# Patient Record
Sex: Male | Born: 1965 | Race: White | Hispanic: No | Marital: Married | Smoking: Never smoker
Health system: Southern US, Community
[De-identification: ages and names within clinical notes are randomized; demographics above are authoritative.]

---

## 2000-12-15 ENCOUNTER — Emergency Department (HOSPITAL_COMMUNITY): Admission: EM | Admit: 2000-12-15 | Discharge: 2000-12-15 | Payer: Self-pay | Admitting: Emergency Medicine

## 2000-12-15 ENCOUNTER — Encounter: Payer: Self-pay | Admitting: Emergency Medicine

## 2008-04-22 ENCOUNTER — Encounter: Admission: RE | Admit: 2008-04-22 | Discharge: 2008-04-22 | Payer: Self-pay | Admitting: Gastroenterology

## 2009-10-31 IMAGING — CT CT ABDOMEN W/ CM
3 of 5 series · 15 of 32 positions shown, 19 images · IV contrast (READICAT/WATER & [ID] OMNI 300)
Comparison: None

CLINICAL DATA: Flank pain

CT ABDOMEN WITH CONTRAST
TECHNIQUE: Multidetector CT imaging of the abdomen was performed
following the standard protocol during bolus administration of
intravenous contrast.
Contrast: 100 ml Jmnipaque-JZZ

[Series 2: abdomen w/ · axial · 0.76mm/px · z∈[-208,-68]mm · 3 of 56 slices shown, 7 images]
[im 14/56  soft-tissue]
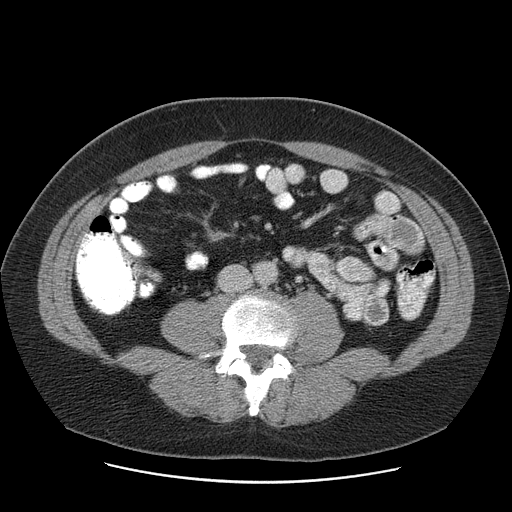
[im 14/56  lung]
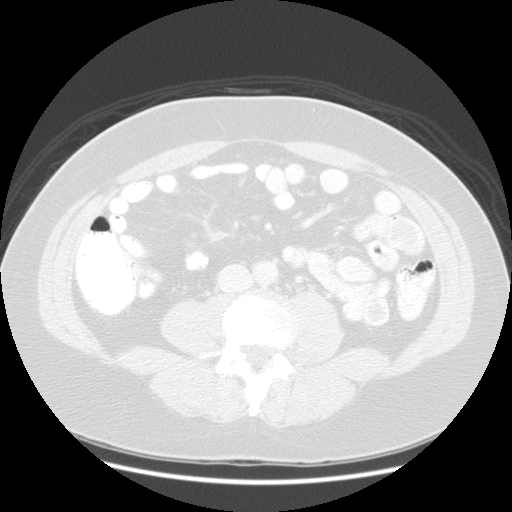
[im 14/56  bone]
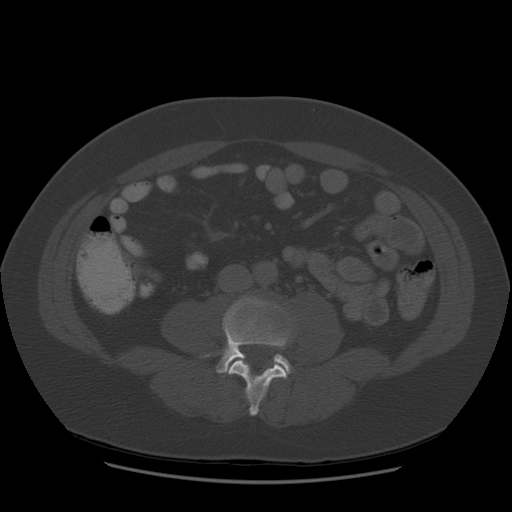
[im 28/56  soft-tissue]
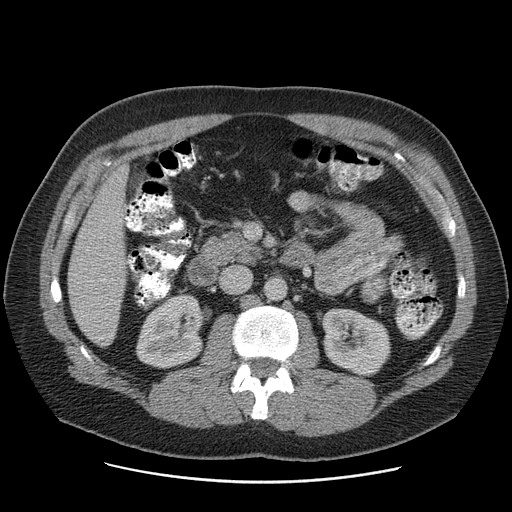
[im 28/56  lung]
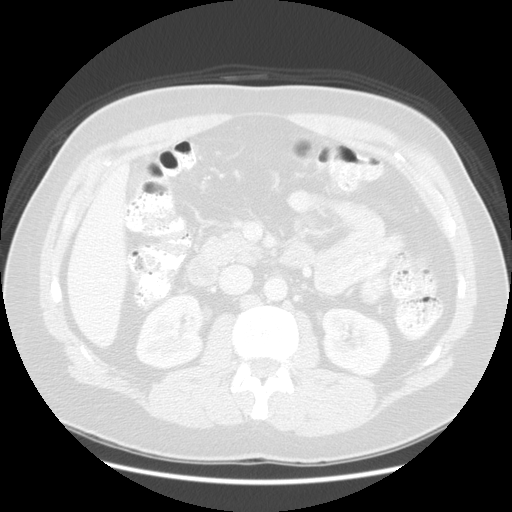
[im 42/56  soft-tissue]
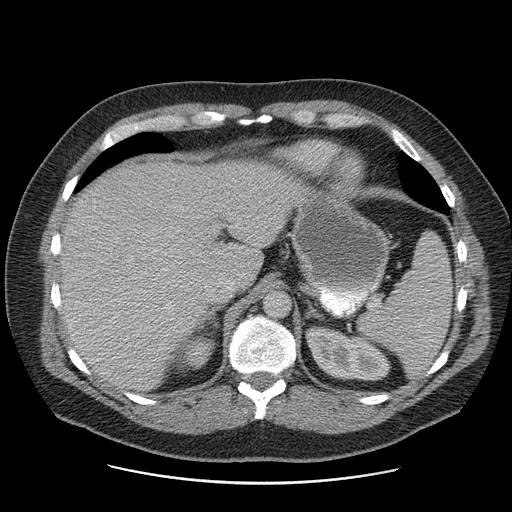
[im 42/56  lung]
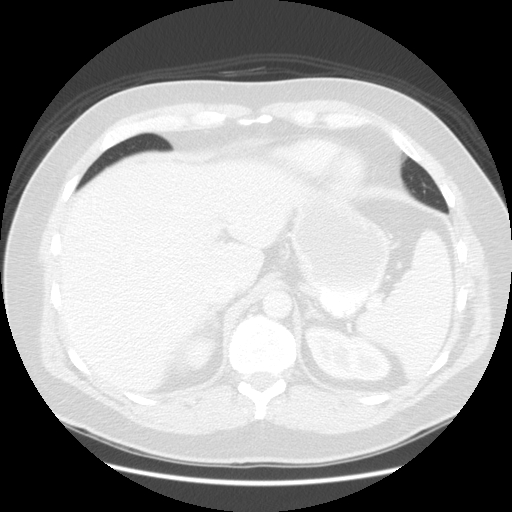

[Series 400: coronal · coronal · 0.76mm/px · 4 of 113 slices shown]
[im 13/113  soft-tissue]
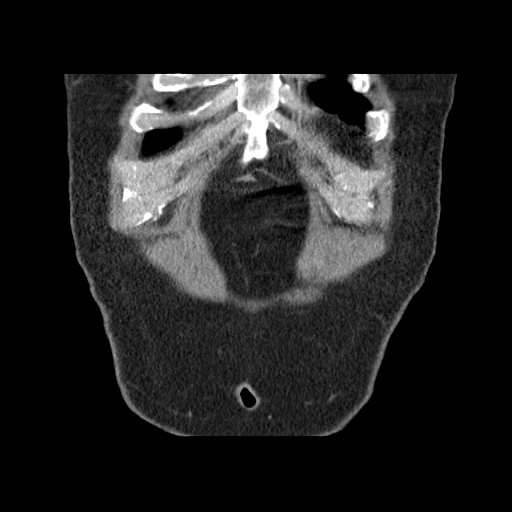
[im 25/113  soft-tissue]
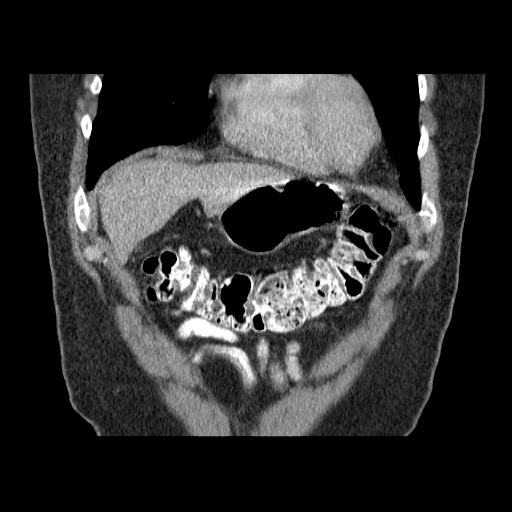
[im 38/113  soft-tissue]
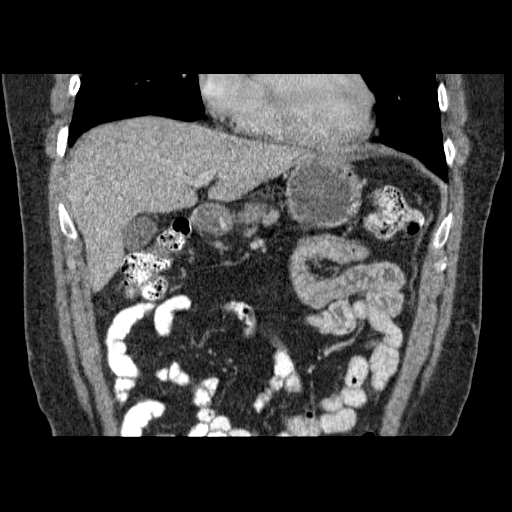
[im 50/113  soft-tissue]
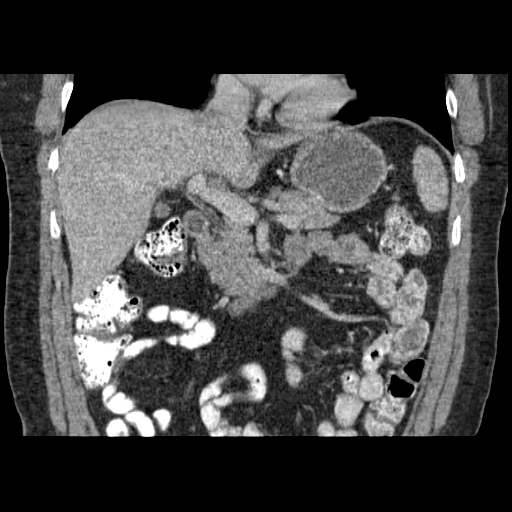

[Series 401: sagittal · sagittal · 0.76mm/px · 8 of 153 slices shown]
[im 13/153  soft-tissue]
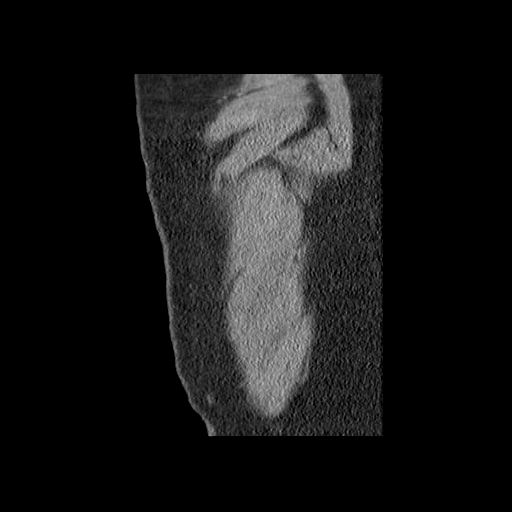
[im 39/153  soft-tissue]
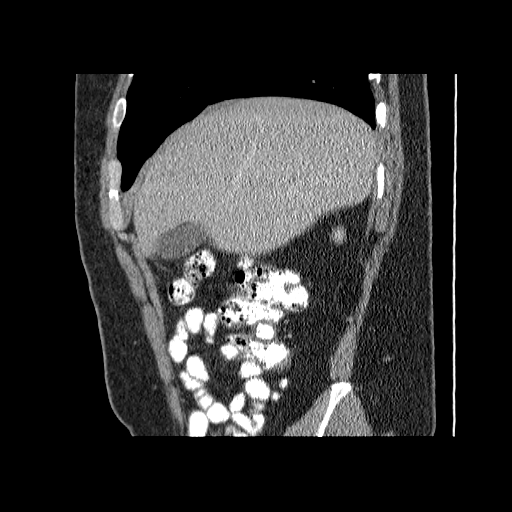
[im 51/153  soft-tissue]
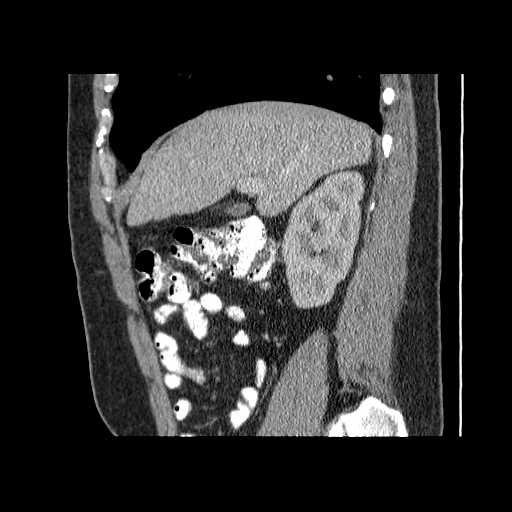
[im 64/153  soft-tissue]
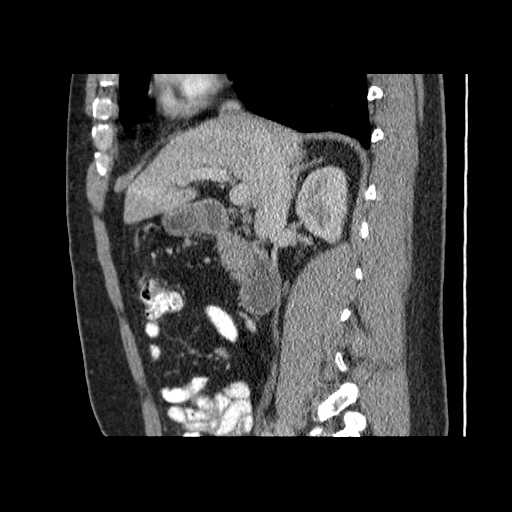
[im 89/153  soft-tissue]
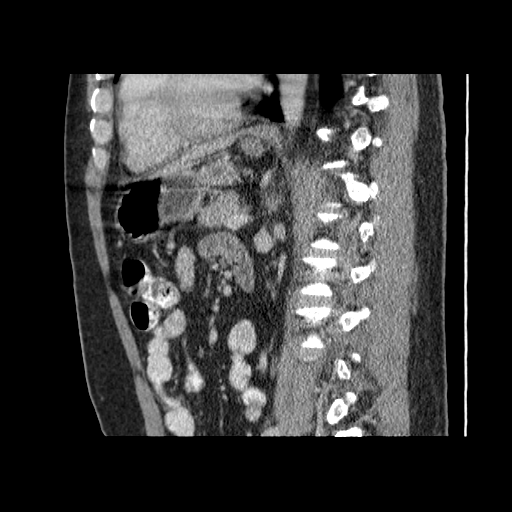
[im 102/153  soft-tissue]
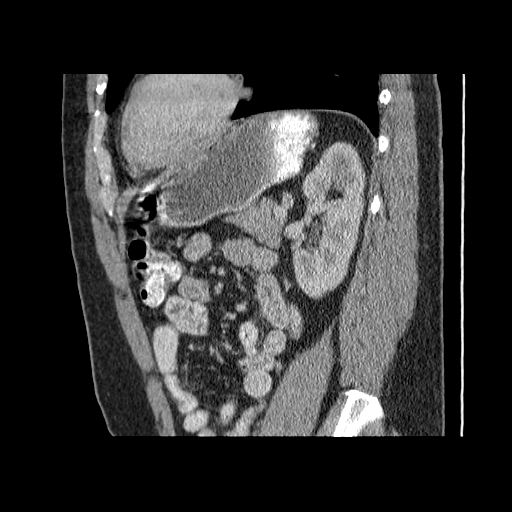
[im 115/153  soft-tissue]
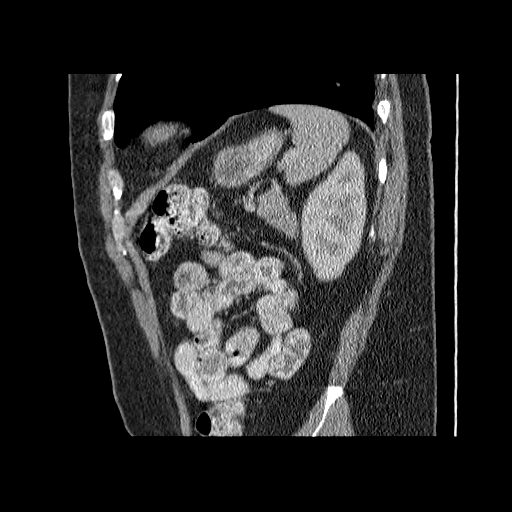
[im 140/153  soft-tissue]
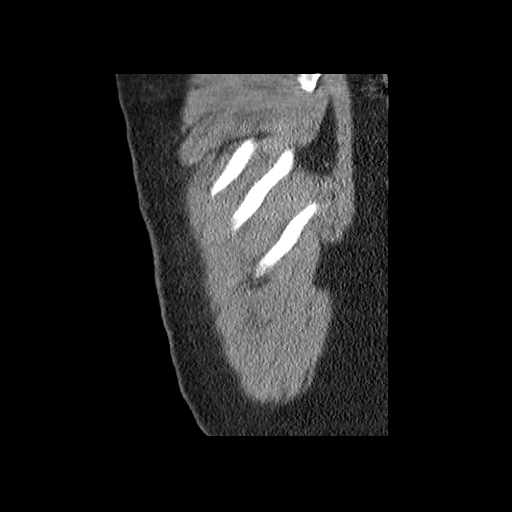

[15 of 32 positions shown; findings below may reference images not displayed]

FINDINGS: Tiny hypodensity in the dome of the liver is too small to
characterize but is probably a tiny cyst.  A similar probable cyst
is seen in the inferior right liver.  No focal abnormalities seen
in the spleen.  The stomach, duodenum, pancreas, gallbladder,
adrenal glands, and kidneys have normal imaging features.  No
evidence for renal stones.  No hydronephrosis.

There is no intraperitoneal free fluid.  No abdominal
lymphadenopathy.  No abdominal aortic aneurysm.

Bone windows show no worrisome lytic or sclerotic osseous lesions.
IMPRESSION: Two tiny hypodensities in the liver parenchyma are most likely
cysts.

Otherwise unremarkable study.

## 2015-04-07 ENCOUNTER — Other Ambulatory Visit: Payer: Self-pay | Admitting: Internal Medicine

## 2015-04-07 ENCOUNTER — Ambulatory Visit
Admission: RE | Admit: 2015-04-07 | Discharge: 2015-04-07 | Disposition: A | Discharge status: Home / Self Care | Payer: BLUE CROSS/BLUE SHIELD | Source: Ambulatory Visit | Attending: Internal Medicine | Admitting: Internal Medicine

## 2015-04-07 DIAGNOSIS — R059 Cough, unspecified: Secondary | ICD-10-CM

## 2015-04-07 DIAGNOSIS — R05 Cough: Secondary | ICD-10-CM

## 2016-03-20 DIAGNOSIS — S66911A Strain of unspecified muscle, fascia and tendon at wrist and hand level, right hand, initial encounter: Secondary | ICD-10-CM | POA: Diagnosis not present

## 2016-10-11 DIAGNOSIS — E669 Obesity, unspecified: Secondary | ICD-10-CM | POA: Diagnosis not present

## 2016-10-11 DIAGNOSIS — Z0000 Encounter for general adult medical examination without abnormal findings: Secondary | ICD-10-CM | POA: Diagnosis not present

## 2016-10-11 DIAGNOSIS — L309 Dermatitis, unspecified: Secondary | ICD-10-CM | POA: Diagnosis not present

## 2017-02-26 DIAGNOSIS — H66001 Acute suppurative otitis media without spontaneous rupture of ear drum, right ear: Secondary | ICD-10-CM | POA: Diagnosis not present

## 2017-03-06 DIAGNOSIS — H65191 Other acute nonsuppurative otitis media, right ear: Secondary | ICD-10-CM | POA: Diagnosis not present

## 2017-03-06 DIAGNOSIS — J069 Acute upper respiratory infection, unspecified: Secondary | ICD-10-CM | POA: Diagnosis not present

## 2017-03-10 DIAGNOSIS — J069 Acute upper respiratory infection, unspecified: Secondary | ICD-10-CM | POA: Diagnosis not present

## 2017-04-18 DIAGNOSIS — R0981 Nasal congestion: Secondary | ICD-10-CM | POA: Diagnosis not present

## 2017-08-22 DIAGNOSIS — E663 Overweight: Secondary | ICD-10-CM | POA: Diagnosis not present

## 2017-08-22 DIAGNOSIS — R202 Paresthesia of skin: Secondary | ICD-10-CM | POA: Diagnosis not present

## 2017-08-22 DIAGNOSIS — Z833 Family history of diabetes mellitus: Secondary | ICD-10-CM | POA: Diagnosis not present

## 2017-10-09 DIAGNOSIS — L570 Actinic keratosis: Secondary | ICD-10-CM | POA: Diagnosis not present

## 2017-10-09 DIAGNOSIS — D2262 Melanocytic nevi of left upper limb, including shoulder: Secondary | ICD-10-CM | POA: Diagnosis not present

## 2017-10-09 DIAGNOSIS — L821 Other seborrheic keratosis: Secondary | ICD-10-CM | POA: Diagnosis not present

## 2017-10-09 DIAGNOSIS — D1801 Hemangioma of skin and subcutaneous tissue: Secondary | ICD-10-CM | POA: Diagnosis not present

## 2017-10-09 DIAGNOSIS — D485 Neoplasm of uncertain behavior of skin: Secondary | ICD-10-CM | POA: Diagnosis not present

## 2017-10-09 DIAGNOSIS — D225 Melanocytic nevi of trunk: Secondary | ICD-10-CM | POA: Diagnosis not present

## 2017-10-15 DIAGNOSIS — Z0000 Encounter for general adult medical examination without abnormal findings: Secondary | ICD-10-CM | POA: Diagnosis not present

## 2017-10-15 DIAGNOSIS — Z13220 Encounter for screening for lipoid disorders: Secondary | ICD-10-CM | POA: Diagnosis not present

## 2017-10-15 DIAGNOSIS — Z125 Encounter for screening for malignant neoplasm of prostate: Secondary | ICD-10-CM | POA: Diagnosis not present

## 2018-03-03 ENCOUNTER — Ambulatory Visit (INDEPENDENT_AMBULATORY_CARE_PROVIDER_SITE_OTHER): Payer: Self-pay | Admitting: Orthopaedic Surgery

## 2018-09-11 ENCOUNTER — Ambulatory Visit (INDEPENDENT_AMBULATORY_CARE_PROVIDER_SITE_OTHER): Payer: BLUE CROSS/BLUE SHIELD | Admitting: Orthopaedic Surgery

## 2018-09-11 ENCOUNTER — Encounter (INDEPENDENT_AMBULATORY_CARE_PROVIDER_SITE_OTHER): Payer: Self-pay | Admitting: Orthopaedic Surgery

## 2018-09-11 ENCOUNTER — Ambulatory Visit (INDEPENDENT_AMBULATORY_CARE_PROVIDER_SITE_OTHER): Payer: Self-pay

## 2018-09-11 VITALS — BP 135/87 | HR 67 | Ht 71.0 in | Wt 220.0 lb

## 2018-09-11 DIAGNOSIS — M79671 Pain in right foot: Secondary | ICD-10-CM | POA: Diagnosis not present

## 2018-09-11 NOTE — Progress Notes (Signed)
Office Visit Note   Patient: Karl SneddonWesley Huynh           Date of Birth: 01-Feb-1966           MRN: [id number] Visit Date: 09/11/2018              Requested by: No referring provider defined for this encounter. PCP: Kirby FunkGriffin, John, MD   Assessment & Plan: Visit Diagnoses:  1. Pain in right foot     Plan: We will place patient in a Swede-O ankle brace for support.  He can call in 6 to 8 weeks if he like to be referred for physical therapy for bilateral peroneal strengthening exercises.  Wise office follow-up as needed.  Follow-Up Instructions: Return if symptoms worsen or fail to improve.   Orders:  Orders Placed This Encounter  Procedures  . XR Foot Complete Right   No orders of the defined types were placed in this encounter.     Procedures: No procedures performed   Clinical Data: No additional findings.   Subjective: Chief Complaint  Patient presents with  . Right Foot - Pain    DOI 09/10/18    HPI 52 year old male with acute injury to his right foot when he slipped going down the stairs on 09/10/2018.  Foot went under him and he has had pain anterolaterally over the ankle and lateral foot at the base of the fifth metatarsal.  He has been able to walk but is limping.  Short stride gait with lateral foot pain.  No past history of ankle injuries.  He has had some intermittent symptoms over the extensor brevis of both ankles and notes that his foot feels weak at times.  Denies associated back pain no chills or fever.  Review of Systems  Constitutional: Negative for chills and diaphoresis.  HENT: Negative for ear discharge, ear pain and nosebleeds.   Eyes: Negative for discharge and visual disturbance.  Respiratory: Negative for cough, choking and shortness of breath.   Cardiovascular: Negative for chest pain and palpitations.  Gastrointestinal: Negative for abdominal distention and abdominal pain.  Endocrine: Negative for cold intolerance and heat intolerance.    Genitourinary: Negative for flank pain and hematuria.  Skin: Negative for rash and wound.  Neurological: Negative for seizures and speech difficulty.  Hematological: Negative for adenopathy. Does not bruise/bleed easily.  Psychiatric/Behavioral: Negative for agitation and suicidal ideas.  No previous surgeries.   Objective: Vital Signs: BP 135/87   Pulse 67   Ht 5\' 11"  (1.803 m)   Wt 220 lb (99.8 kg)   BMI 30.68 kg/m   Physical Exam Constitutional:      Appearance: He is well-developed.  HENT:     Head: Normocephalic and atraumatic.  Eyes:     Pupils: Pupils are equal, round, and reactive to light.  Neck:     Thyroid: No thyromegaly.     Trachea: No tracheal deviation.  Cardiovascular:     Rate and Rhythm: Normal rate.  Pulmonary:     Effort: Pulmonary effort is normal.     Breath sounds: No wheezing.  Abdominal:     General: Bowel sounds are normal.     Palpations: Abdomen is soft.  Skin:    General: Skin is warm and dry.     Capillary Refill: Capillary refill takes less than 2 seconds.  Neurological:     Mental Status: He is alert and oriented to person, place, and time.  Psychiatric:        Behavior:  Behavior normal.        Thought Content: Thought content normal.        Judgment: Judgment normal.     Ortho Exam patient has some tenderness of the extensor brevis without weakness.  Tenderness over the peroneal brevis insertion and mild tenderness over the proximal fifth metatarsal without ecchymosis.  1+ symmetrical anterior drawer both ankles.  Peroneals take good resistance.  No heel cord tightness.  Pulses are normal.  Negative popliteal compression test.  Specialty Comments:  No specialty comments available.  Imaging: Xr Foot Complete Right  Result Date: 09/11/2018 Three-view x-rays right foot obtained and reviewed.  These are negative for acute changes.  Some mild dorsal degenerative spurring noted with high arch.  Soft tissue calcification. Impression:  Normal right foot x-rays negative for acute changes.    PMFS History: There are no active problems to display for this patient.  History reviewed. No pertinent past medical history.  History reviewed. No pertinent family history.  History reviewed. No pertinent surgical history. Social History   Occupational History  . Not on file  Tobacco Use  . Smoking status: Never Smoker  . Smokeless tobacco: Never Used  Substance and Sexual Activity  . Alcohol use: Yes  . Drug use: Not on file  . Sexual activity: Not on file

## 2018-10-20 DIAGNOSIS — D1801 Hemangioma of skin and subcutaneous tissue: Secondary | ICD-10-CM | POA: Diagnosis not present

## 2018-10-20 DIAGNOSIS — D2262 Melanocytic nevi of left upper limb, including shoulder: Secondary | ICD-10-CM | POA: Diagnosis not present

## 2018-10-20 DIAGNOSIS — L570 Actinic keratosis: Secondary | ICD-10-CM | POA: Diagnosis not present

## 2018-10-20 DIAGNOSIS — D225 Melanocytic nevi of trunk: Secondary | ICD-10-CM | POA: Diagnosis not present

## 2018-10-20 DIAGNOSIS — L821 Other seborrheic keratosis: Secondary | ICD-10-CM | POA: Diagnosis not present

## 2018-10-27 DIAGNOSIS — Z23 Encounter for immunization: Secondary | ICD-10-CM | POA: Diagnosis not present

## 2018-10-27 DIAGNOSIS — E669 Obesity, unspecified: Secondary | ICD-10-CM | POA: Diagnosis not present

## 2018-10-27 DIAGNOSIS — Z0000 Encounter for general adult medical examination without abnormal findings: Secondary | ICD-10-CM | POA: Diagnosis not present

## 2018-10-27 DIAGNOSIS — M67432 Ganglion, left wrist: Secondary | ICD-10-CM | POA: Diagnosis not present

## 2018-11-04 ENCOUNTER — Encounter (INDEPENDENT_AMBULATORY_CARE_PROVIDER_SITE_OTHER): Payer: Self-pay | Admitting: Orthopaedic Surgery

## 2018-11-04 ENCOUNTER — Ambulatory Visit (INDEPENDENT_AMBULATORY_CARE_PROVIDER_SITE_OTHER): Payer: BLUE CROSS/BLUE SHIELD

## 2018-11-04 ENCOUNTER — Ambulatory Visit (INDEPENDENT_AMBULATORY_CARE_PROVIDER_SITE_OTHER): Payer: BLUE CROSS/BLUE SHIELD | Admitting: Orthopaedic Surgery

## 2018-11-04 DIAGNOSIS — M71332 Other bursal cyst, left wrist: Secondary | ICD-10-CM

## 2018-11-04 DIAGNOSIS — M67432 Ganglion, left wrist: Secondary | ICD-10-CM | POA: Insufficient documentation

## 2018-11-04 MED ORDER — METHYLPREDNISOLONE ACETATE 40 MG/ML IJ SUSP
13.3300 mg | INTRAMUSCULAR | Status: AC | PRN
  Administered 2018-11-04: 13.33 mg

## 2018-11-04 MED ORDER — BUPIVACAINE HCL 0.25 % IJ SOLN
0.3300 mL | INTRAMUSCULAR | Status: AC | PRN
  Administered 2018-11-04: .33 mL

## 2018-11-04 MED ORDER — LIDOCAINE HCL 1 % IJ SOLN
0.3000 mL | INTRAMUSCULAR | Status: AC | PRN
  Administered 2018-11-04: .3 mL

## 2018-11-04 NOTE — Progress Notes (Signed)
   Office Visit Note   Patient: Karl Huynh           Date of Birth: 03-Feb-1966           MRN: [id number] Visit Date: 11/04/2018              Requested by: Kirby Funk, MD [address] [place], [place] [address] PCP: Kirby Funk, MD   Assessment & Plan: Visit Diagnoses:  1. Ganglion cyst of volar aspect of left wrist   2. Synovial cyst of left wrist     Plan: Impression is left wrist volar ganglion cyst.  We will aspirate this and inject it with cortisone today.  Typical ganglion cyst fluid was aspirated.  No evidence of infection or unexpected pathology.  Discussed other treatment options.  Questions encouraged and answered.  Immobilize in wrist brace for 2 weeks.  Follow-up with [place] as needed.  Follow-Up Instructions: Return if symptoms worsen or fail to improve.   Orders:  Orders Placed This Encounter  Procedures  . Hand/UE Inj: L wrist  . XR Wrist Complete Left   No orders of the defined types were placed in this encounter.     Procedures: Hand/UE Inj: L wrist for volar carpal ganglion on 11/04/2018 2:20 PM Details: volar approach Medications: 0.3 mL lidocaine 1 %; 0.33 mL bupivacaine 0.25 %; 13.33 mg methylPREDNISolone acetate 40 MG/ML      Clinical Data: No additional findings.   Subjective: Chief Complaint  Patient presents with  . Left Wrist - Pain    HPI patient is a pleasant 53 year old gentleman who presents our clinic today with concerns about his left wrist.  History of what appears to be a volar ganglion for the past 5 months.  This initially did enlarge, but he has not noticed a change in size over the past 2 to 3 months.  No known injury.  No fevers or chills.  This is asymptomatic.  He was seen by his PCP week or so ago and was referred here to get it checked out.  Review of Systems as detailed in HPI.  All others reviewed and are negative.   Objective: Vital Signs: There were no vitals taken for this visit.  Physical Exam  well-developed and well-nourished gentleman in no acute distress.  Alert and oriented x3.  Ortho Exam examination of his left wrist reveals a 1 x 1 cm volar ganglion radial side.  This is nontender.  No skin changes.  He is neurovascular intact distally.  Specialty Comments:  No specialty comments available.  Imaging: Xr Wrist Complete Left  Result Date: 11/04/2018 No acute or structural abnormalities    PMFS History: Patient Active Problem List   Diagnosis Date Noted  . Ganglion cyst of volar aspect of left wrist 11/04/2018   History reviewed. No pertinent past medical history.  History reviewed. No pertinent family history.  History reviewed. No pertinent surgical history. Social History   Occupational History  . Not on file  Tobacco Use  . Smoking status: Never Smoker  . Smokeless tobacco: Never Used  Substance and Sexual Activity  . Alcohol use: Yes  . Drug use: Not on file  . Sexual activity: Not on file

## 2019-07-29 DIAGNOSIS — R1031 Right lower quadrant pain: Secondary | ICD-10-CM | POA: Diagnosis not present

## 2019-10-28 DIAGNOSIS — D2339 Other benign neoplasm of skin of other parts of face: Secondary | ICD-10-CM | POA: Diagnosis not present

## 2019-10-28 DIAGNOSIS — D2262 Melanocytic nevi of left upper limb, including shoulder: Secondary | ICD-10-CM | POA: Diagnosis not present

## 2019-10-28 DIAGNOSIS — D1801 Hemangioma of skin and subcutaneous tissue: Secondary | ICD-10-CM | POA: Diagnosis not present

## 2019-10-28 DIAGNOSIS — L821 Other seborrheic keratosis: Secondary | ICD-10-CM | POA: Diagnosis not present

## 2019-10-28 DIAGNOSIS — L570 Actinic keratosis: Secondary | ICD-10-CM | POA: Diagnosis not present

## 2019-10-28 DIAGNOSIS — D2239 Melanocytic nevi of other parts of face: Secondary | ICD-10-CM | POA: Diagnosis not present

## 2019-10-28 DIAGNOSIS — D225 Melanocytic nevi of trunk: Secondary | ICD-10-CM | POA: Diagnosis not present

## 2019-11-03 DIAGNOSIS — Z125 Encounter for screening for malignant neoplasm of prostate: Secondary | ICD-10-CM | POA: Diagnosis not present

## 2019-11-03 DIAGNOSIS — Z0000 Encounter for general adult medical examination without abnormal findings: Secondary | ICD-10-CM | POA: Diagnosis not present

## 2019-11-03 DIAGNOSIS — Z131 Encounter for screening for diabetes mellitus: Secondary | ICD-10-CM | POA: Diagnosis not present

## 2019-11-03 DIAGNOSIS — Z1159 Encounter for screening for other viral diseases: Secondary | ICD-10-CM | POA: Diagnosis not present

## 2019-11-08 DIAGNOSIS — R7309 Other abnormal glucose: Secondary | ICD-10-CM | POA: Diagnosis not present

## 2020-06-07 DIAGNOSIS — K1329 Other disturbances of oral epithelium, including tongue: Secondary | ICD-10-CM | POA: Diagnosis not present

## 2020-06-08 DIAGNOSIS — K1329 Other disturbances of oral epithelium, including tongue: Secondary | ICD-10-CM | POA: Diagnosis not present

## 2020-07-14 DIAGNOSIS — R109 Unspecified abdominal pain: Secondary | ICD-10-CM | POA: Diagnosis not present

## 2020-08-09 DIAGNOSIS — Z800 Family history of malignant neoplasm of digestive organs: Secondary | ICD-10-CM | POA: Diagnosis not present

## 2020-08-09 DIAGNOSIS — Z8371 Family history of colonic polyps: Secondary | ICD-10-CM | POA: Diagnosis not present

## 2020-08-09 DIAGNOSIS — R1032 Left lower quadrant pain: Secondary | ICD-10-CM | POA: Diagnosis not present

## 2020-08-21 DIAGNOSIS — N400 Enlarged prostate without lower urinary tract symptoms: Secondary | ICD-10-CM | POA: Diagnosis not present

## 2020-08-21 DIAGNOSIS — R109 Unspecified abdominal pain: Secondary | ICD-10-CM | POA: Diagnosis not present

## 2020-08-21 DIAGNOSIS — K760 Fatty (change of) liver, not elsewhere classified: Secondary | ICD-10-CM | POA: Diagnosis not present

## 2020-08-28 DIAGNOSIS — Z1211 Encounter for screening for malignant neoplasm of colon: Secondary | ICD-10-CM | POA: Diagnosis not present

## 2020-08-28 DIAGNOSIS — Z800 Family history of malignant neoplasm of digestive organs: Secondary | ICD-10-CM | POA: Diagnosis not present

## 2020-08-28 DIAGNOSIS — R1032 Left lower quadrant pain: Secondary | ICD-10-CM | POA: Diagnosis not present

## 2020-08-28 DIAGNOSIS — Z8371 Family history of colonic polyps: Secondary | ICD-10-CM | POA: Diagnosis not present

## 2021-09-03 ENCOUNTER — Other Ambulatory Visit: Payer: Self-pay

## 2021-09-03 ENCOUNTER — Ambulatory Visit (INDEPENDENT_AMBULATORY_CARE_PROVIDER_SITE_OTHER): Payer: Self-pay | Admitting: Orthopedic Surgery

## 2021-09-03 ENCOUNTER — Encounter: Payer: Self-pay | Admitting: Orthopedic Surgery

## 2021-09-03 DIAGNOSIS — M6701 Short Achilles tendon (acquired), right ankle: Secondary | ICD-10-CM

## 2021-09-03 DIAGNOSIS — M722 Plantar fascial fibromatosis: Secondary | ICD-10-CM

## 2021-09-03 NOTE — Progress Notes (Signed)
° °  Office Visit Note   Patient: Karl Huynh           Date of Birth: February 22, 1966           MRN: [id number] Visit Date: 09/03/2021              Requested by: Kirby Funk, MD [address] [place],  [place] [address] PCP: Kirby Funk, MD  Chief Complaint  Patient presents with   Right Foot - Pain      HPI: Patient is a 55 year old gentleman who is seen for initial evaluation for plantar fascial right foot pain beneath the mid aspect of his arch.  He states he has had pain for several months no specific injury.  He states he recently has worn a pair shoes that he has of worn and this seems to have triggered the pain.  Patient states he is also been having some sciatic symptoms which radiate from the lumbar spine down the right lower extremity.  Patient states this is worse while sitting in a car but seems unrelated to his foot pain.  Assessment & Plan: Visit Diagnoses:  1. Plantar fasciitis, right   2. Achilles tendon contracture, right     Plan: Patient was given instructions and demonstrated Achilles stretching to do 5 times a day a minute at a time.  Recommended a stiff soled shoe and sole orthotics.  Recommended topical Voltaren gel 3 times a day.  Follow-Up Instructions: Return in about 2 months (around 11/04/2021).   Ortho Exam  Patient is alert, oriented, no adenopathy, well-dressed, normal affect, normal respiratory effort. Examination patient has good pulses he has good ankle good subtalar motion he has a high arch and is tender to palpation along the plantar fascia mid substance.  There are no palpable defects no nodules.  He has a good dorsalis pedis and posterior tibial pulse.  He has dorsiflexion of the ankle 10 degrees short of neutral with the knee extended.  There is a negative straight leg raise and no focal motor weakness.  Imaging: No results found. No images are attached to the encounter.  Labs: No results found for: HGBA1C, ESRSEDRATE, CRP,  LABURIC, REPTSTATUS, GRAMSTAIN, CULT, LABORGA   No results found for: ALBUMIN, PREALBUMIN, CBC  No results found for: MG No results found for: VD25OH  No results found for: PREALBUMIN No flowsheet data found.   There is no height or weight on file to calculate BMI.  Orders:  No orders of the defined types were placed in this encounter.  No orders of the defined types were placed in this encounter.    Procedures: No procedures performed  Clinical Data: No additional findings.  ROS:  All other systems negative, except as noted in the HPI. Review of Systems  Objective: Vital Signs: There were no vitals taken for this visit.  Specialty Comments:  No specialty comments available.  PMFS History: Patient Active Problem List   Diagnosis Date Noted   Ganglion cyst of volar aspect of left wrist 11/04/2018   History reviewed. No pertinent past medical history.  History reviewed. No pertinent family history.  History reviewed. No pertinent surgical history. Social History   Occupational History   Not on file  Tobacco Use   Smoking status: Never   Smokeless tobacco: Never  Substance and Sexual Activity   Alcohol use: Yes   Drug use: Not on file   Sexual activity: Not on file

## 2021-11-05 ENCOUNTER — Other Ambulatory Visit: Payer: Self-pay

## 2021-11-05 ENCOUNTER — Ambulatory Visit (INDEPENDENT_AMBULATORY_CARE_PROVIDER_SITE_OTHER): Payer: 59 | Admitting: Orthopedic Surgery

## 2021-11-05 DIAGNOSIS — M216X2 Other acquired deformities of left foot: Secondary | ICD-10-CM | POA: Diagnosis not present

## 2021-11-05 DIAGNOSIS — M6701 Short Achilles tendon (acquired), right ankle: Secondary | ICD-10-CM | POA: Diagnosis not present

## 2021-11-05 DIAGNOSIS — M722 Plantar fascial fibromatosis: Secondary | ICD-10-CM

## 2021-11-05 DIAGNOSIS — M216X1 Other acquired deformities of right foot: Secondary | ICD-10-CM | POA: Diagnosis not present

## 2021-11-06 ENCOUNTER — Encounter: Payer: Self-pay | Admitting: Orthopedic Surgery

## 2021-11-06 NOTE — Progress Notes (Signed)
° °  Office Visit Note   Patient: Karl Huynh           Date of Birth: 05/10/1966           MRN: [id number] Visit Date: 11/05/2021              Requested by: Kirby Funk, MD [address] [place],  [place] [address] PCP: Kirby Funk, MD  Chief Complaint  Patient presents with   Right Foot - Follow-up      HPI: Patient is a 56 year old gentleman who presents in follow-up for right foot plantar fasciitis.  Patient states the Voltaren gel and stretching has helped he states he is now having pain in the left foot beneath the fifth metatarsal head.  Assessment & Plan: Visit Diagnoses:  1. Plantar fasciitis, right   2. Achilles tendon contracture, right   3. Acquired cavovarus deformity of both feet     Plan: Patient does have a varus hindfoot with cavovarus deformity with a plantarflexed first ray that is overloading the lateral column.  We cut out the medial column of his orthotic and this did help unload the lateral column.  Discussed that the next nonoperative step would be to support underneath the lateral column to get the hindfoot out of varus.  Recommended that he can move the cleat on his bicycling shoe posterior to unload the metatarsal heads while on the trainer.  Follow-Up Instructions: Return if symptoms worsen or fail to improve.   Ortho Exam  Patient is alert, oriented, no adenopathy, well-dressed, normal affect, normal respiratory effort. Examination patient has a high arch bilaterally with a plantarflexed first ray and a varus calcaneus.  This stands overloads the lateral column worse on the left foot than the right.  He has callus beneath the fifth metatarsal head of the left foot and this is painful to palpation.  The right foot plantar fascia symptoms have improved the dorsiflexion of the ankle has improved  Imaging: No results found. No images are attached to the encounter.  Labs: No results found for: HGBA1C, ESRSEDRATE, CRP, LABURIC,  REPTSTATUS, GRAMSTAIN, CULT, LABORGA   No results found for: ALBUMIN, PREALBUMIN, CBC  No results found for: MG No results found for: VD25OH  No results found for: PREALBUMIN No flowsheet data found.   There is no height or weight on file to calculate BMI.  Orders:  No orders of the defined types were placed in this encounter.  No orders of the defined types were placed in this encounter.    Procedures: No procedures performed  Clinical Data: No additional findings.  ROS:  All other systems negative, except as noted in the HPI. Review of Systems  Objective: Vital Signs: There were no vitals taken for this visit.  Specialty Comments:  No specialty comments available.  PMFS History: Patient Active Problem List   Diagnosis Date Noted   Ganglion cyst of volar aspect of left wrist 11/04/2018   History reviewed. No pertinent past medical history.  History reviewed. No pertinent family history.  History reviewed. No pertinent surgical history. Social History   Occupational History   Not on file  Tobacco Use   Smoking status: Never   Smokeless tobacco: Never  Substance and Sexual Activity   Alcohol use: Yes   Drug use: Not on file   Sexual activity: Not on file

## 2021-11-15 ENCOUNTER — Other Ambulatory Visit: Payer: Self-pay

## 2021-11-15 ENCOUNTER — Ambulatory Visit (INDEPENDENT_AMBULATORY_CARE_PROVIDER_SITE_OTHER): Payer: 59

## 2021-11-15 ENCOUNTER — Encounter: Payer: Self-pay | Admitting: Podiatry

## 2021-11-15 ENCOUNTER — Ambulatory Visit: Payer: 59 | Admitting: Podiatry

## 2021-11-15 DIAGNOSIS — M21611 Bunion of right foot: Secondary | ICD-10-CM

## 2021-11-15 DIAGNOSIS — M7752 Other enthesopathy of left foot: Secondary | ICD-10-CM | POA: Diagnosis not present

## 2021-11-15 DIAGNOSIS — M21619 Bunion of unspecified foot: Secondary | ICD-10-CM

## 2021-11-15 DIAGNOSIS — M722 Plantar fascial fibromatosis: Secondary | ICD-10-CM

## 2021-11-15 DIAGNOSIS — M21612 Bunion of left foot: Secondary | ICD-10-CM | POA: Diagnosis not present

## 2021-11-15 MED ORDER — TRIAMCINOLONE ACETONIDE 10 MG/ML IJ SUSP
10.0000 mg | Freq: Once | INTRAMUSCULAR | Status: AC
  Administered 2021-11-15: 10 mg

## 2021-11-18 NOTE — Progress Notes (Signed)
Subjective:  ? ?Patient ID: Karl Huynh, male   DOB: 56 y.o.   MRN: [id number]  ? ?HPI ?Patient states he has had a lot of pain more in his left foot than his right foot with the outside of both feet really bothering him and the left becoming quite inflamed recently.  Patient states he gets occasional discomfort in the plantar fascia also and has had always prominence around the fifth metatarsal head.  Patient does not smoke likes to be active ? ? ?Review of Systems  ?All other systems reviewed and are negative. ? ? ?   ?Objective:  ?Physical Exam ?Vitals and nursing note reviewed.  ?Constitutional:   ?   Appearance: He is well-developed.  ?Pulmonary:  ?   Effort: Pulmonary effort is normal.  ?Musculoskeletal:     ?   General: Normal range of motion.  ?Skin: ?   General: Skin is warm.  ?Neurological:  ?   Mental Status: He is alert.  ?  ?Neurovascular status is found to be intact muscle strength is found to be adequate range of motion adequate with inflammation fluid around the fifth MPJ left foot over right foot with plantar keratotic lesion lucent core that is painful when pressed with prominence of the metatarsal.  Patient also has moderate discomfort in the plantar fascia which is low level and just bothersome for him and has good digital perfusion well oriented ? ?   ?Assessment:  ?Inflammatory capsulitis fifth MPJ left over right with pain associated with this and lesion formation that is deep and its orientation with keratotic tissue.  Patient also has moderate discomfort plantar fascia not significant and does have an enlargement of the fifth metatarsal head with fluid buildup associated and consistent with tailor's bunion deformity ? ?   ?Plan:  ?H&P reviewed conditions and explained to him etiology of acute pain versus chronic.  At this point reviewed x-rays I then did sterile prep I injected the fifth MPJ left 3 mg dexamethasone Kenalog 5 mg Xylocaine debrided plantar lesion advised on support and I  want to see him when symptomatic and ultimately could require surgical intervention if symptoms come back quickly.  He will he will just do exercises and stretches and he is encouraged to call questions concerns ? ?X-rays indicate there is enlargement of the fifth metatarsal head bilateral no indications of advanced arthritis or other pathology ?   ? ? ?

## 2022-05-16 ENCOUNTER — Ambulatory Visit: Payer: 59 | Admitting: Orthopaedic Surgery

## 2022-05-16 ENCOUNTER — Ambulatory Visit (INDEPENDENT_AMBULATORY_CARE_PROVIDER_SITE_OTHER): Payer: 59

## 2022-05-16 DIAGNOSIS — M25562 Pain in left knee: Secondary | ICD-10-CM

## 2022-05-16 MED ORDER — IBUPROFEN 800 MG PO TABS: 800.0000 mg | ORAL_TABLET | Freq: Three times a day (TID) | ORAL | 2 refills | Status: AC | PRN

## 2022-05-16 NOTE — Progress Notes (Signed)
Office Visit Note   Patient: Karl Huynh           Date of Birth: 12/02/1965           MRN: [id number] Visit Date: 05/16/2022              Requested by: Kirby Funk, MD [address] [place],  [place] [address] PCP: Kirby Funk, MD   Assessment & Plan: Visit Diagnoses:  1. Left knee pain, unspecified chronicity     Plan: Impression is left knee pain with effusion.  This sounds like an OA exacerbation and less likely meniscal problem.  Treatment options were reviewed and he will start with 2 weeks of rest and Motrin 800 mg 3 times a day and compression to the knee.  If symptoms persist may need to consider aspiration and cortisone injection.  Follow-Up Instructions: No follow-ups on file.   Orders:  Orders Placed This Encounter  Procedures   XR KNEE 3 VIEW LEFT   Meds ordered this encounter  Medications   ibuprofen (ADVIL) 800 MG tablet    Sig: Take 1 tablet (800 mg total) by mouth every 8 (eight) hours as needed.    Dispense:  30 tablet    Refill:  2      Procedures: No procedures performed   Clinical Data: No additional findings.   Subjective: Chief Complaint  Patient presents with   Left Knee - Pain    HPI Patient is a 56 year old gentleman Here for evaluation of left knee pain since last Monday.  Denies any particular injuries.Marland Kitchen  Has been playing quite a bit of golf recently.  Feels swelling and tightness when he flexes his knee.  Denies any mechanical symptoms or giving way.  Review of Systems  Constitutional: Negative.   All other systems reviewed and are negative.    Objective: Vital Signs: There were no vitals taken for this visit.  Physical Exam Vitals and nursing note reviewed.  Constitutional:      Appearance: He is well-developed.  HENT:     Head: Normocephalic and atraumatic.  Eyes:     Pupils: Pupils are equal, round, and reactive to light.  Pulmonary:     Effort: Pulmonary effort is normal.  Abdominal:      Palpations: Abdomen is soft.  Musculoskeletal:        General: No deformity. Normal range of motion.     Cervical back: Neck supple.  Skin:    General: Skin is warm.  Neurological:     Mental Status: He is alert and oriented to person, place, and time.  Psychiatric:        Behavior: Behavior normal.        Thought Content: Thought content normal.        Judgment: Judgment normal.     Ortho Exam Examination of left knee shows a moderate joint effusion.  No signs of infection.  Range of motion is mildly limited secondary to the effusion.  No joint line tenderness.  Collaterals and cruciates are stable. Specialty Comments:  No specialty comments available.  Imaging: XR KNEE 3 VIEW LEFT  Result Date: 05/16/2022 Mild osteoarthritis with periarticular spurring.  Well-preserved joint spaces.    PMFS History: Patient Active Problem List   Diagnosis Date Noted   Ganglion cyst of volar aspect of left wrist 11/04/2018   No past medical history on file.  No family history on file.  No past surgical history on file. Social History  Occupational History   Not on file  Tobacco Use   Smoking status: Never   Smokeless tobacco: Never  Substance and Sexual Activity   Alcohol use: Yes   Drug use: Not on file   Sexual activity: Not on file

## 2022-06-04 ENCOUNTER — Ambulatory Visit: Payer: 59 | Admitting: Orthopaedic Surgery

## 2022-06-04 ENCOUNTER — Encounter: Payer: Self-pay | Admitting: Orthopaedic Surgery

## 2022-06-04 DIAGNOSIS — M25562 Pain in left knee: Secondary | ICD-10-CM

## 2022-06-04 DIAGNOSIS — G8929 Other chronic pain: Secondary | ICD-10-CM

## 2022-06-04 MED ORDER — LIDOCAINE HCL 1 % IJ SOLN
2.0000 mL | INTRAMUSCULAR | Status: AC | PRN
  Administered 2022-06-04: 2 mL

## 2022-06-04 MED ORDER — METHYLPREDNISOLONE ACETATE 40 MG/ML IJ SUSP
40.0000 mg | INTRAMUSCULAR | Status: AC | PRN
  Administered 2022-06-04: 40 mg via INTRA_ARTICULAR

## 2022-06-04 MED ORDER — BUPIVACAINE HCL 0.5 % IJ SOLN
2.0000 mL | INTRAMUSCULAR | Status: AC | PRN
  Administered 2022-06-04: 2 mL via INTRA_ARTICULAR

## 2022-06-04 NOTE — Progress Notes (Signed)
   Office Visit Note   Patient: Karl Huynh           Date of Birth: 08-Feb-1966           MRN: [id number] Visit Date: 06/04/2022              Requested by: Lavone Orn, MD [address] [place],  [place] [address] PCP: Lavone Orn, MD   Assessment & Plan: Visit Diagnoses:  1. Chronic pain of left knee     Plan: Impression is chronic left knee pain without significant improvement after a period of rest and NSAIDs.  Based on options we agreed to try cortisone injection today.  He will let me know if symptoms persist beyond a couple weeks at which point we will need an MRI.  Follow-Up Instructions: No follow-ups on file.   Orders:  No orders of the defined types were placed in this encounter.  No orders of the defined types were placed in this encounter.     Procedures: Large Joint Inj: L knee on 06/04/2022 5:14 PM Details: 22 G needle Medications: 2 mL bupivacaine 0.5 %; 2 mL lidocaine 1 %; 40 mg methylPREDNISolone acetate 40 MG/ML Outcome: tolerated well, no immediate complications Patient was prepped and draped in the usual sterile fashion.       Clinical Data: No additional findings.   Subjective: Chief Complaint  Patient presents with   Left Knee - Follow-up    HPI Karl Huynh returns today for continued left knee pain.  This did improve with Motrin and rest but pain restarted when he returned back to hitting golf balls.  Review of Systems   Objective: Vital Signs: There were no vitals taken for this visit.  Physical Exam  Ortho Exam Examination left knee shows a trace effusion.  No joint line tenderness. Specialty Comments:  No specialty comments available.  Imaging: No results found.   PMFS History: Patient Active Problem List   Diagnosis Date Noted   Ganglion cyst of volar aspect of left wrist 11/04/2018   History reviewed. No pertinent past medical history.  No family history on file.  History reviewed. No pertinent  surgical history. Social History   Occupational History   Not on file  Tobacco Use   Smoking status: Never   Smokeless tobacco: Never  Substance and Sexual Activity   Alcohol use: Yes   Drug use: Not on file   Sexual activity: Not on file

## 2022-06-24 ENCOUNTER — Encounter: Payer: Self-pay | Admitting: Orthopaedic Surgery

## 2022-06-25 ENCOUNTER — Other Ambulatory Visit: Payer: Self-pay

## 2022-06-25 DIAGNOSIS — G8929 Other chronic pain: Secondary | ICD-10-CM

## 2022-07-15 ENCOUNTER — Ambulatory Visit
Admission: RE | Admit: 2022-07-15 | Discharge: 2022-07-15 | Disposition: A | Discharge status: Home / Self Care | Payer: 59 | Source: Ambulatory Visit | Attending: Orthopaedic Surgery | Admitting: Orthopaedic Surgery

## 2022-07-15 DIAGNOSIS — G8929 Other chronic pain: Secondary | ICD-10-CM

## 2022-07-17 ENCOUNTER — Ambulatory Visit: Payer: 59 | Admitting: Orthopaedic Surgery

## 2022-07-17 DIAGNOSIS — M1712 Unilateral primary osteoarthritis, left knee: Secondary | ICD-10-CM | POA: Diagnosis not present

## 2022-07-17 NOTE — Progress Notes (Signed)
   Office Visit Note   Patient: Karl Huynh           Date of Birth: [birth year]           MRN: [id number] Visit Date: 07/17/2022              Requested by: Lavone Orn, MD [address] [place],  [place] [address] PCP: Lavone Orn, MD   Assessment & Plan: Visit Diagnoses:  1. Primary osteoarthritis of left knee     Plan: Overall findings of the MRI consistent with degenerative joint disease and chondromalacia.  The menisci look degenerative without a real tear.  Disease process and treatment options were reviewed.  He will pick up a knee brace and use Voltaren gel.  Sounds like he had a good response to the previous cortisone injection.  Continue over-the-counter NSAIDs as needed.  At this point he will follow-up with [place] as needed.  Follow-Up Instructions: No follow-ups on file.   Orders:  No orders of the defined types were placed in this encounter.  No orders of the defined types were placed in this encounter.     Procedures: No procedures performed   Clinical Data: No additional findings.   Subjective: Chief Complaint  Patient presents with   Left Knee - Pain    HPI Karl Huynh returns today to discuss left knee MRI.  Overall he is doing okay.  He was able to play golf last week.  He does note some increased swelling with increased activity.  He denies any mechanical symptoms in his knee. Review of Systems   Objective: Vital Signs: There were no vitals taken for this visit.  Physical Exam  Ortho Exam Examination of the left knee shows no real joint effusion.  No medial joint line tenderness. Specialty Comments:  No specialty comments available.  Imaging: No results found.   PMFS History: Patient Active Problem List   Diagnosis Date Noted   Primary osteoarthritis of left knee 07/17/2022   Ganglion cyst of volar aspect of left wrist 11/04/2018   No past medical history on file.  No family history on file.  No past surgical  history on file. Social History   Occupational History   Not on file  Tobacco Use   Smoking status: Never   Smokeless tobacco: Never  Substance and Sexual Activity   Alcohol use: Yes   Drug use: Not on file   Sexual activity: Not on file

## 2024-01-07 ENCOUNTER — Ambulatory Visit (HOSPITAL_COMMUNITY)
Admission: RE | Admit: 2024-01-07 | Discharge: 2024-01-07 | Disposition: A | Discharge status: Home / Self Care | Source: Ambulatory Visit | Attending: Internal Medicine | Admitting: Internal Medicine

## 2024-01-07 ENCOUNTER — Other Ambulatory Visit (HOSPITAL_COMMUNITY): Payer: Self-pay | Admitting: Internal Medicine

## 2024-01-07 DIAGNOSIS — R1032 Left lower quadrant pain: Secondary | ICD-10-CM

## 2024-01-07 MED ORDER — IOHEXOL 9 MG/ML PO SOLN: 1000.0000 mL | ORAL | Status: AC

## 2024-01-07 MED ORDER — IOHEXOL 300 MG/ML  SOLN
100.0000 mL | Freq: Once | INTRAMUSCULAR | Status: AC | PRN
  Administered 2024-01-07: 100 mL via INTRAVENOUS
